# Patient Record
Sex: Female | Born: 2000
Health system: Southern US, Community
[De-identification: ages and names within clinical notes are randomized; demographics above are authoritative.]

## PROBLEM LIST (undated history)

## (undated) DIAGNOSIS — E669 Obesity, unspecified: Secondary | ICD-10-CM

## (undated) DIAGNOSIS — G43909 Migraine, unspecified, not intractable, without status migrainosus: Secondary | ICD-10-CM

## (undated) DIAGNOSIS — J4530 Mild persistent asthma, uncomplicated: Secondary | ICD-10-CM

## (undated) DIAGNOSIS — R109 Unspecified abdominal pain: Secondary | ICD-10-CM

## (undated) DIAGNOSIS — E785 Hyperlipidemia, unspecified: Secondary | ICD-10-CM

## (undated) DIAGNOSIS — F419 Anxiety disorder, unspecified: Secondary | ICD-10-CM

## (undated) DIAGNOSIS — Z635 Disruption of family by separation and divorce: Secondary | ICD-10-CM

## (undated) DIAGNOSIS — F909 Attention-deficit hyperactivity disorder, unspecified type: Secondary | ICD-10-CM

## (undated) DIAGNOSIS — F919 Conduct disorder, unspecified: Secondary | ICD-10-CM

## (undated) DIAGNOSIS — E559 Vitamin D deficiency, unspecified: Secondary | ICD-10-CM

## (undated) DIAGNOSIS — N912 Amenorrhea, unspecified: Secondary | ICD-10-CM

## (undated) DIAGNOSIS — R51 Headache: Secondary | ICD-10-CM

## (undated) DIAGNOSIS — F913 Oppositional defiant disorder: Secondary | ICD-10-CM

## (undated) DIAGNOSIS — F329 Major depressive disorder, single episode, unspecified: Secondary | ICD-10-CM

## (undated) DIAGNOSIS — F4325 Adjustment disorder with mixed disturbance of emotions and conduct: Secondary | ICD-10-CM

## (undated) DIAGNOSIS — J4521 Mild intermittent asthma with (acute) exacerbation: Secondary | ICD-10-CM

## (undated) DIAGNOSIS — F32A Depression, unspecified: Secondary | ICD-10-CM

## (undated) DIAGNOSIS — F912 Conduct disorder, adolescent-onset type: Secondary | ICD-10-CM

## (undated) DIAGNOSIS — K5900 Constipation, unspecified: Secondary | ICD-10-CM

## (undated) DIAGNOSIS — J309 Allergic rhinitis, unspecified: Secondary | ICD-10-CM

## (undated) DIAGNOSIS — L209 Atopic dermatitis, unspecified: Secondary | ICD-10-CM

## (undated) HISTORY — DX: Major depressive disorder, single episode, unspecified: F32.9

## (undated) HISTORY — DX: Depression, unspecified: F32.A

## (undated) HISTORY — DX: Oppositional defiant disorder: F91.3

## (undated) HISTORY — DX: Attention-deficit hyperactivity disorder, unspecified type: F90.9

## (undated) HISTORY — DX: Headache: R51

## (undated) HISTORY — DX: Unspecified abdominal pain: R10.9

---
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
## 2021-01-11 DIAGNOSIS — Z1331 Encounter for screening for depression: Secondary | ICD-10-CM | POA: Diagnosis not present

## 2021-01-11 DIAGNOSIS — L680 Hirsutism: Secondary | ICD-10-CM | POA: Diagnosis not present

## 2021-01-11 DIAGNOSIS — F419 Anxiety disorder, unspecified: Secondary | ICD-10-CM | POA: Diagnosis not present

## 2021-01-11 DIAGNOSIS — L732 Hidradenitis suppurativa: Secondary | ICD-10-CM | POA: Diagnosis not present

## 2021-01-11 DIAGNOSIS — Z1389 Encounter for screening for other disorder: Secondary | ICD-10-CM | POA: Diagnosis not present

## 2021-01-11 DIAGNOSIS — E282 Polycystic ovarian syndrome: Secondary | ICD-10-CM | POA: Diagnosis not present

## 2021-02-08 DIAGNOSIS — E282 Polycystic ovarian syndrome: Secondary | ICD-10-CM | POA: Diagnosis not present

## 2021-02-08 DIAGNOSIS — F419 Anxiety disorder, unspecified: Secondary | ICD-10-CM | POA: Diagnosis not present

## 2021-04-05 DIAGNOSIS — E282 Polycystic ovarian syndrome: Secondary | ICD-10-CM | POA: Diagnosis not present

## 2021-04-05 DIAGNOSIS — F419 Anxiety disorder, unspecified: Secondary | ICD-10-CM | POA: Diagnosis not present

## 2021-04-05 DIAGNOSIS — Z68.41 Body mass index (BMI) pediatric, greater than or equal to 95th percentile for age: Secondary | ICD-10-CM | POA: Diagnosis not present

## 2021-07-06 ENCOUNTER — Encounter: Payer: Self-pay | Admitting: Emergency Medicine

## 2021-07-06 ENCOUNTER — Ambulatory Visit
Admission: EM | Admit: 2021-07-06 | Discharge: 2021-07-06 | Disposition: A | Discharge status: Home / Self Care | Payer: BC Managed Care – PPO | Attending: Emergency Medicine | Admitting: Emergency Medicine

## 2021-07-06 ENCOUNTER — Other Ambulatory Visit: Payer: Self-pay

## 2021-07-06 DIAGNOSIS — Z20822 Contact with and (suspected) exposure to covid-19: Secondary | ICD-10-CM | POA: Diagnosis not present

## 2021-07-06 DIAGNOSIS — J020 Streptococcal pharyngitis: Secondary | ICD-10-CM | POA: Insufficient documentation

## 2021-07-06 DIAGNOSIS — J029 Acute pharyngitis, unspecified: Secondary | ICD-10-CM | POA: Diagnosis not present

## 2021-07-06 LAB — POCT RAPID STREP A (OFFICE): Rapid Strep A Screen: NEGATIVE

## 2021-07-06 MED ORDER — ACETAMINOPHEN 325 MG PO TABS
650.0000 mg | ORAL_TABLET | Freq: Once | ORAL | Status: AC
  Administered 2021-07-06: 650 mg via ORAL

## 2021-07-06 MED ORDER — ONDANSETRON 8 MG PO TBDP: ORAL_TABLET | ORAL | 0 refills | Status: DC

## 2021-07-06 MED ORDER — DEXAMETHASONE SODIUM PHOSPHATE 10 MG/ML IJ SOLN
10.0000 mg | Freq: Once | INTRAMUSCULAR | Status: AC
  Administered 2021-07-06: 10 mg via INTRAMUSCULAR

## 2021-07-06 MED ORDER — IBUPROFEN 600 MG PO TABS: 600.0000 mg | ORAL_TABLET | Freq: Four times a day (QID) | ORAL | 0 refills | Status: DC | PRN

## 2021-07-06 NOTE — ED Triage Notes (Signed)
Body aches, headaches, cough, sore throat and fever started yesterday.  Patient is vomiting too

## 2021-07-06 NOTE — ED Provider Notes (Signed)
HPI  SUBJECTIVE:  Kimberly Munoz is a 20 y.o. female who presents with fevers T-max 100.6, body aches, headaches, nasal congestion, sore throat, postnasal drip, loss of taste, occasional cough, nausea, 5 episodes of emesis starting yesterday.  She states that she is having difficulty tolerating p.o. no rhinorrhea, loss of sense of smell, wheezing, shortness of breath, diarrhea, abdominal pain, rash.  She states that she feels as if her throat is swelling shut.  No muffled voice, drooling, trismus, neck stiffness.  No strep, COVID, flu exposure.  She did not get the COVID or flu vaccines.  No antibiotics in the past month.  No antipyretic in the past 6 hours.  She has tried DayQuil and Chloraseptic throat lozenges.  The throat lozenges help.  Symptoms are worse with swallowing.  She has a past medical history of asthma, PCOS and prediabetes.  LMP: Irregular.  PMD: [facility].  Past Medical History:  Diagnosis Date   Abdominal pain    ADHD (attention deficit hyperactivity disorder)    Adjustment disorder with mixed disturbance of emotions and conduct 03/27/2019   Amenorrhea 03/27/2019   Anxiety disorder 03/27/2019   Asthma    Atopic dermatitis, unspecified 03/27/2019   Conduct disorder, adolescent onset type 03/27/2019   Conduct disorder, unspecified 03/27/2019   Constipation, unspecified 03/27/2019   Depression    Family disruption due to divorce or legal separation 03/27/2019   Head ache 03/27/2019   Headache(784.0)    Hyperlipidemia 03/27/2019   Major depressive disorder 03/27/2019   Migraine    Mild intermittent asthma with acute exacerbation 03/27/2019   Mild persistent asthma 03/27/2019   Obesity, unspecified 03/27/2019   Oppositional defiant disorder    Rhinitis, allergic 03/27/2019   Vitamin D deficiency, unspecified 03/27/2019    History reviewed. No pertinent surgical history.  Family History  Problem Relation Age of Onset   Bipolar disorder Father    Drug abuse  Father    Schizophrenia Father    Migraines Father    Spinal muscular atrophy Father    Diabetes type II Father    Migraines Mother    Hypothyroidism Mother    Migraines Paternal Grandmother    Diabetes type II Paternal Grandmother    Bladder Cancer Paternal Grandmother    Kidney disease Paternal Grandmother    Heart attack Paternal Grandfather    Diabetes type II Paternal Grandfather    Heart failure Paternal Grandfather    Hypertension Paternal Grandfather    Hypertension Maternal Grandfather    GER disease Sister    Anxiety disorder Sister    Depression Sister    GER disease Brother    Ulcers Brother    ADD / ADHD Maternal Aunt    Migraines Maternal Aunt        2 Aunts   Other Maternal Aunt        1 Aunt has Learning Differences   Cholelithiasis Maternal Aunt    Dementia Neg Hx    Celiac disease Neg Hx     Social History   Tobacco Use   Smoking status: Never    Passive exposure: Yes   Smokeless tobacco: Never  Vaping Use   Vaping Use: Some days  Substance Use Topics   Alcohol use: No   Drug use: No     Current Facility-Administered Medications:    dexamethasone (DECADRON) injection 10 mg, 10 mg, Intramuscular, Once, Domenick Gong, MD  Current Outpatient Medications:    ibuprofen (ADVIL) 600 MG tablet, Take 1 tablet (600 mg  total) by mouth every 6 (six) hours as needed., Disp: 30 tablet, Rfl: 0   ondansetron (ZOFRAN-ODT) 8 MG disintegrating tablet, 1/2- 1 tablet q 8 hr prn nausea, vomiting, Disp: 20 tablet, Rfl: 0   cholecalciferol (VITAMIN D) 25 MCG (1000 UNIT) tablet, TAKE 4 TABLETS (4,000 UNITS TOTAL) BY MOUTH DAILY. (Patient not taking: Reported on 07/06/2021), Disp: 100 tablet, Rfl: 11   escitalopram (LEXAPRO) 20 MG tablet, Take 1 tablet (20 mg total) by mouth daily., Disp: 90 tablet, Rfl: 3   fluticasone (FLONASE) 50 MCG/ACT nasal spray, SPRAY 1 SPRAY INTO BOTH NOSTRILS DAILY., Disp: 48 mL, Rfl: 1   ketoconazole (NIZORAL) 2 % shampoo, APPLY 1  APPLICATION TOPICALLY 2 (TWO) TIMES A WEEK., Disp: 120 mL, Rfl: 0   Melatonin 3 MG CAPS, Take 1 capsule (3 mg total) by mouth at bedtime., Disp: 30 capsule, Rfl: 2   metFORMIN (GLUCOPHAGE) 1000 MG tablet, Take 1 tablet (1,000 mg total) by mouth 2 (two) times daily with a meal., Disp: 60 tablet, Rfl: 6  Allergies  Allergen Reactions   Amoxicillin Rash   Zoloft [Sertraline Hcl] Hives     ROS  As noted in HPI.   Physical Exam  BP 133/79 (BP Location: Right Arm) Comment (BP Location): large cuff  Pulse 83   Temp (!) 100.6 F (38.1 C) (Oral)   Resp 20   SpO2 99%   Constitutional: Well developed, well nourished, no acute distress Eyes:  EOMI, conjunctiva normal bilaterally HENT: Normocephalic, atraumatic,mucus membranes moist.  No nasal congestion.  No sinus tenderness.  Tonsils erythematous, swollen with exudates.  Uvula midline. Neck: No cervical lymphadenopathy.  Respiratory: Normal inspiratory effort, lungs clear bilaterally Cardiovascular: Normal rate, regular rhythm, no murmurs GI: nondistended soft, no splenomegaly skin: No rash, skin intact Musculoskeletal: no deformities Neurologic: Alert & oriented x 3, no focal neuro deficits Psychiatric: Speech and behavior appropriate   ED Course   Medications  dexamethasone (DECADRON) injection 10 mg (has no administration in time range)  acetaminophen (TYLENOL) tablet 650 mg (650 mg Oral Given 07/06/21 1933)    Orders Placed This Encounter  Procedures   Culture, group A strep    Standing Status:   Standing    Number of Occurrences:   1   Covid-19, Flu A+B ([facility])    Standing Status:   Standing    Number of Occurrences:   1   POCT rapid strep A    Standing Status:   Standing    Number of Occurrences:   1    Results for orders placed or performed during the hospital encounter of 07/06/21 (from the past 24 hour(s))  POCT rapid strep A     Status: None   Collection Time: 07/06/21  7:39 PM  Result Value Ref Range    Rapid Strep A Screen Negative Negative   No results found.  ED Clinical Impression  1. Exudative pharyngitis   2. Streptococcal sore throat   3. Sore throat   4. Encounter for laboratory testing for COVID-19 virus      ED Assessment/Plan  Patient with an exudative pharyngitis.  Will give 10 mg of dexamethasone for pain and swelling. Centor score 2/4.  Rapid strep negative.  Throat culture sent.  Will call in prescription of antibiotics if it comes back positive.  COVID and flu also sent . will call in Molnupiravir if COVID is positive due to unvaccinated status, BMI above 30, history of asthma, or Tamiflu if flu is positive.  Home with  Zofran, Tylenol/ibuprofen Benadryl/Maalox mixture.  Follow-up with PMD as needed.  ER return precautions given.  COVID, flu pending at the time of initial signing of this note  Discussed labs, MDM, treatment plan, and plan for follow-up with parent and patient. Discussed sn/sx that should prompt return to the ED.They agree with plan.   Meds ordered this encounter  Medications   acetaminophen (TYLENOL) tablet 650 mg   dexamethasone (DECADRON) injection 10 mg   ondansetron (ZOFRAN-ODT) 8 MG disintegrating tablet    Sig: 1/2- 1 tablet q 8 hr prn nausea, vomiting    Dispense:  20 tablet    Refill:  0   ibuprofen (ADVIL) 600 MG tablet    Sig: Take 1 tablet (600 mg total) by mouth every 6 (six) hours as needed.    Dispense:  30 tablet    Refill:  0      *This clinic note was created using Scientist, clinical (histocompatibility and immunogenetics). Therefore, there may be occasional mistakes despite careful proofreading.  ?    Domenick Gong, MD 07/07/21 [phone]

## 2021-07-06 NOTE — Discharge Instructions (Addendum)
your rapid strep was negative today, so we have sent off a throat culture.  We will contact you and call in the appropriate antibiotics if your culture comes back positive for an infection requiring antibiotic treatment.  Give [place] a working phone number.    1 gram of Tylenol and 600 mg ibuprofen together 3-4 times a day as needed for pain.  Make sure you drink plenty of extra fluids.  Some people find salt water gargles and  Traditional Medicinal's "Throat Coat" tea helpful. Take 5 mL of liquid Benadryl and 5 mL of Maalox. Mix it together, and then hold it in your mouth for as long as you can and then swallow. You may do this 4 times a day.  Zofran as needed for nausea vomiting.  Push electrolyte containing fluids such as Pedialyte or Gatorade until your urine is clear.  I will prescribe Tamiflu if your flu is positive or Molnupiravir if your COVID is positive.  We will have that result back in a day or 2.  Go to www.goodrx.com  or www.costplusdrugs.com to look up your medications. This will give you a list of where you can find your prescriptions at the most affordable prices. Or ask the pharmacist what the cash price is, or if they have any other discount programs available to help make your medication more affordable. This can be less expensive than what you would pay with insurance.

## 2021-07-07 LAB — COVID-19, FLU A+B NAA
Influenza A, NAA: NOT DETECTED
Influenza B, NAA: NOT DETECTED
SARS-CoV-2, NAA: NOT DETECTED
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
## 2021-07-10 LAB — CULTURE, GROUP A STREP (THRC)

## 2021-07-14 DIAGNOSIS — E282 Polycystic ovarian syndrome: Secondary | ICD-10-CM | POA: Diagnosis not present

## 2021-07-14 DIAGNOSIS — Z68.41 Body mass index (BMI) pediatric, greater than or equal to 95th percentile for age: Secondary | ICD-10-CM | POA: Diagnosis not present

## 2021-07-14 DIAGNOSIS — F419 Anxiety disorder, unspecified: Secondary | ICD-10-CM | POA: Diagnosis not present

## 2022-02-15 DIAGNOSIS — Z6841 Body Mass Index (BMI) 40.0 and over, adult: Secondary | ICD-10-CM | POA: Insufficient documentation

## 2022-02-15 DIAGNOSIS — J45909 Unspecified asthma, uncomplicated: Secondary | ICD-10-CM | POA: Insufficient documentation

## 2022-02-15 DIAGNOSIS — K3580 Unspecified acute appendicitis: Secondary | ICD-10-CM | POA: Insufficient documentation

## 2022-02-15 DIAGNOSIS — F909 Attention-deficit hyperactivity disorder, unspecified type: Secondary | ICD-10-CM | POA: Insufficient documentation

## 2022-02-15 DIAGNOSIS — F419 Anxiety disorder, unspecified: Secondary | ICD-10-CM | POA: Insufficient documentation

## 2022-02-15 DIAGNOSIS — F32A Depression, unspecified: Secondary | ICD-10-CM | POA: Insufficient documentation

## 2022-02-16 ENCOUNTER — Emergency Department (HOSPITAL_COMMUNITY): Payer: Self-pay

## 2022-02-16 ENCOUNTER — Other Ambulatory Visit: Payer: Self-pay

## 2022-02-16 ENCOUNTER — Emergency Department (HOSPITAL_BASED_OUTPATIENT_CLINIC_OR_DEPARTMENT_OTHER): Payer: Self-pay | Admitting: Certified Registered Nurse Anesthetist

## 2022-02-16 ENCOUNTER — Encounter (HOSPITAL_COMMUNITY): Payer: Self-pay | Admitting: Emergency Medicine

## 2022-02-16 ENCOUNTER — Encounter (HOSPITAL_COMMUNITY)
Admission: EM | Disposition: A | Discharge status: Home / Self Care | Payer: Self-pay | Source: Home / Self Care | Attending: Emergency Medicine

## 2022-02-16 ENCOUNTER — Emergency Department (HOSPITAL_COMMUNITY): Payer: Self-pay | Admitting: Certified Registered Nurse Anesthetist

## 2022-02-16 ENCOUNTER — Ambulatory Visit (HOSPITAL_COMMUNITY)
Admission: EM | Admit: 2022-02-16 | Discharge: 2022-02-16 | Disposition: A | Discharge status: Home / Self Care | Payer: Self-pay | Attending: General Surgery | Admitting: General Surgery

## 2022-02-16 DIAGNOSIS — K3580 Unspecified acute appendicitis: Secondary | ICD-10-CM

## 2022-02-16 DIAGNOSIS — J45909 Unspecified asthma, uncomplicated: Secondary | ICD-10-CM

## 2022-02-16 DIAGNOSIS — Z68.41 Body mass index (BMI) pediatric, greater than or equal to 95th percentile for age: Secondary | ICD-10-CM

## 2022-02-16 DIAGNOSIS — K353 Acute appendicitis with localized peritonitis, without perforation or gangrene: Secondary | ICD-10-CM | POA: Insufficient documentation

## 2022-02-16 HISTORY — PX: LAPAROSCOPIC APPENDECTOMY: SHX408

## 2022-02-16 LAB — URINALYSIS, ROUTINE W REFLEX MICROSCOPIC
Bilirubin Urine: NEGATIVE
Glucose, UA: NEGATIVE mg/dL
Hgb urine dipstick: NEGATIVE
Ketones, ur: NEGATIVE mg/dL
Leukocytes,Ua: NEGATIVE
Nitrite: NEGATIVE
Protein, ur: NEGATIVE mg/dL
Specific Gravity, Urine: 1.02 (ref 1.005–1.030)
pH: 6.5 (ref 5.0–8.0)

## 2022-02-16 LAB — CBC
HCT: 41.2 % (ref 36.0–46.0)
Hemoglobin: 13.1 g/dL (ref 12.0–15.0)
MCH: 27.2 pg (ref 26.0–34.0)
MCHC: 31.8 g/dL (ref 30.0–36.0)
MCV: 85.7 fL (ref 80.0–100.0)
Platelets: 337 10*3/uL (ref 150–400)
RBC: 4.81 MIL/uL (ref 3.87–5.11)
RDW: 13.3 % (ref 11.5–15.5)
WBC: 7.6 10*3/uL (ref 4.0–10.5)
nRBC: 0 % (ref 0.0–0.2)

## 2022-02-16 LAB — COMPREHENSIVE METABOLIC PANEL
ALT: 27 U/L (ref 0–44)
AST: 17 U/L (ref 15–41)
Albumin: 4 g/dL (ref 3.5–5.0)
Alkaline Phosphatase: 53 U/L (ref 38–126)
Anion gap: 5 (ref 5–15)
BUN: 9 mg/dL (ref 6–20)
CO2: 27 mmol/L (ref 22–32)
Calcium: 9 mg/dL (ref 8.9–10.3)
Chloride: 106 mmol/L (ref 98–111)
Creatinine, Ser: 0.62 mg/dL (ref 0.44–1.00)
GFR, Estimated: >60 mL/min (ref >60)
Glucose, Bld: 140 mg/dL — ABNORMAL HIGH (ref 70–99)
Potassium: 3.7 mmol/L (ref 3.5–5.1)
Sodium: 138 mmol/L (ref 135–145)
Total Bilirubin: 0.2 mg/dL — ABNORMAL LOW (ref 0.3–1.2)
Total Protein: 7.5 g/dL (ref 6.5–8.1)

## 2022-02-16 LAB — LIPASE, BLOOD: Lipase: 30 U/L (ref 11–51)

## 2022-02-16 LAB — PREGNANCY, URINE: Preg Test, Ur: NEGATIVE

## 2022-02-16 SURGERY — APPENDECTOMY, LAPAROSCOPIC
Anesthesia: General | Site: Abdomen

## 2022-02-16 MED ORDER — LACTATED RINGERS IV SOLN: INTRAVENOUS | Status: DC | PRN

## 2022-02-16 MED ORDER — MEPERIDINE HCL 50 MG/ML IJ SOLN: 6.2500 mg | INTRAMUSCULAR | Status: DC | PRN

## 2022-02-16 MED ORDER — SUCCINYLCHOLINE CHLORIDE 200 MG/10ML IV SOSY
PREFILLED_SYRINGE | INTRAVENOUS | Status: AC
  Filled 2022-02-16: qty 10

## 2022-02-16 MED ORDER — HYDROCODONE-ACETAMINOPHEN 5-325 MG PO TABS: 1.0000 | ORAL_TABLET | ORAL | 0 refills | Status: AC | PRN

## 2022-02-16 MED ORDER — SODIUM CHLORIDE FLUSH 0.9 % IV SOLN
INTRAVENOUS | Status: AC
  Filled 2022-02-16: qty 10

## 2022-02-16 MED ORDER — BUPIVACAINE LIPOSOME 1.3 % IJ SUSP
INTRAMUSCULAR | Status: AC
  Filled 2022-02-16: qty 20

## 2022-02-16 MED ORDER — MIDAZOLAM HCL 2 MG/2ML IJ SOLN
INTRAMUSCULAR | Status: AC
  Filled 2022-02-16: qty 2

## 2022-02-16 MED ORDER — ROCURONIUM BROMIDE 100 MG/10ML IV SOLN
INTRAVENOUS | Status: DC | PRN
  Administered 2022-02-16: 50 mg via INTRAVENOUS

## 2022-02-16 MED ORDER — LACTATED RINGERS IV SOLN: INTRAVENOUS | Status: DC

## 2022-02-16 MED ORDER — CHLORHEXIDINE GLUCONATE CLOTH 2 % EX PADS: 6.0000 | MEDICATED_PAD | Freq: Once | CUTANEOUS | Status: DC

## 2022-02-16 MED ORDER — KETAMINE HCL 50 MG/5ML IJ SOSY
PREFILLED_SYRINGE | INTRAMUSCULAR | Status: AC
  Filled 2022-02-16: qty 5

## 2022-02-16 MED ORDER — DEXAMETHASONE SODIUM PHOSPHATE 10 MG/ML IJ SOLN
INTRAMUSCULAR | Status: DC | PRN
  Administered 2022-02-16: 10 mg via INTRAVENOUS

## 2022-02-16 MED ORDER — KETAMINE HCL 10 MG/ML IJ SOLN
INTRAMUSCULAR | Status: DC | PRN
  Administered 2022-02-16: 10 mg via INTRAVENOUS

## 2022-02-16 MED ORDER — PROPOFOL 10 MG/ML IV BOLUS
INTRAVENOUS | Status: AC
  Filled 2022-02-16: qty 20

## 2022-02-16 MED ORDER — IOHEXOL 350 MG/ML SOLN
100.0000 mL | Freq: Once | INTRAVENOUS | Status: AC | PRN
  Administered 2022-02-16: 100 mL via INTRAVENOUS

## 2022-02-16 MED ORDER — FENTANYL CITRATE (PF) 100 MCG/2ML IJ SOLN
INTRAMUSCULAR | Status: DC | PRN
  Administered 2022-02-16 (×3): 50 ug via INTRAVENOUS
  Administered 2022-02-16: 100 ug via INTRAVENOUS

## 2022-02-16 MED ORDER — ONDANSETRON HCL 4 MG/2ML IJ SOLN
INTRAMUSCULAR | Status: DC | PRN
  Administered 2022-02-16: 8 mg via INTRAVENOUS

## 2022-02-16 MED ORDER — ROCURONIUM BROMIDE 10 MG/ML (PF) SYRINGE
PREFILLED_SYRINGE | INTRAVENOUS | Status: AC
  Filled 2022-02-16: qty 10

## 2022-02-16 MED ORDER — KETOROLAC TROMETHAMINE 15 MG/ML IJ SOLN
INTRAMUSCULAR | Status: DC | PRN
  Administered 2022-02-16: 30 mg via INTRAVENOUS

## 2022-02-16 MED ORDER — FENTANYL CITRATE (PF) 250 MCG/5ML IJ SOLN
INTRAMUSCULAR | Status: AC
  Filled 2022-02-16: qty 5

## 2022-02-16 MED ORDER — SUGAMMADEX SODIUM 500 MG/5ML IV SOLN
INTRAVENOUS | Status: AC
  Filled 2022-02-16: qty 5

## 2022-02-16 MED ORDER — DEXAMETHASONE SODIUM PHOSPHATE 10 MG/ML IJ SOLN
INTRAMUSCULAR | Status: AC
  Filled 2022-02-16: qty 1

## 2022-02-16 MED ORDER — HYDROMORPHONE HCL 1 MG/ML IJ SOLN
0.2500 mg | INTRAMUSCULAR | Status: DC | PRN
  Administered 2022-02-16: 0.5 mg via INTRAVENOUS
  Filled 2022-02-16: qty 0.5

## 2022-02-16 MED ORDER — BUPIVACAINE LIPOSOME 1.3 % IJ SUSP
INTRAMUSCULAR | Status: DC | PRN
  Administered 2022-02-16: 20 mL

## 2022-02-16 MED ORDER — MIDAZOLAM HCL 5 MG/5ML IJ SOLN
INTRAMUSCULAR | Status: DC | PRN
  Administered 2022-02-16: 2 mg via INTRAVENOUS

## 2022-02-16 MED ORDER — ONDANSETRON HCL 4 MG/2ML IJ SOLN: 4.0000 mg | Freq: Once | INTRAMUSCULAR | Status: DC | PRN

## 2022-02-16 MED ORDER — KETOROLAC TROMETHAMINE 30 MG/ML IJ SOLN
INTRAMUSCULAR | Status: AC
  Filled 2022-02-16: qty 1

## 2022-02-16 MED ORDER — ORAL CARE MOUTH RINSE: 15.0000 mL | Freq: Once | OROMUCOSAL | Status: DC

## 2022-02-16 MED ORDER — METRONIDAZOLE 500 MG/100ML IV SOLN
500.0000 mg | Freq: Two times a day (BID) | INTRAVENOUS | Status: DC
  Administered 2022-02-16: 500 mg via INTRAVENOUS
  Filled 2022-02-16: qty 100

## 2022-02-16 MED ORDER — SODIUM CHLORIDE 0.9 % IV SOLN
1.0000 g | Freq: Once | INTRAVENOUS | Status: AC
  Administered 2022-02-16: 1 g via INTRAVENOUS
  Filled 2022-02-16: qty 10

## 2022-02-16 MED ORDER — ONDANSETRON HCL 4 MG/2ML IJ SOLN
INTRAMUSCULAR | Status: AC
  Filled 2022-02-16: qty 4

## 2022-02-16 MED ORDER — CHLORHEXIDINE GLUCONATE 0.12 % MT SOLN: 15.0000 mL | Freq: Once | OROMUCOSAL | Status: DC

## 2022-02-16 MED ORDER — LIDOCAINE 2% (20 MG/ML) 5 ML SYRINGE
INTRAMUSCULAR | Status: DC | PRN
  Administered 2022-02-16: 100 mg via INTRAVENOUS

## 2022-02-16 MED ORDER — PROPOFOL 10 MG/ML IV BOLUS
INTRAVENOUS | Status: DC | PRN
  Administered 2022-02-16: 50 mg via INTRAVENOUS

## 2022-02-16 MED ORDER — LIDOCAINE HCL (PF) 2 % IJ SOLN
INTRAMUSCULAR | Status: AC
  Filled 2022-02-16: qty 5

## 2022-02-16 MED ORDER — SODIUM CHLORIDE 0.9 % IR SOLN
Status: DC | PRN
  Administered 2022-02-16: 1000 mL

## 2022-02-16 SURGICAL SUPPLY — 48 items
ADH SKN CLS APL DERMABOND .7 (GAUZE/BANDAGES/DRESSINGS) ×1
APL PRP STRL LF DISP 70% ISPRP (MISCELLANEOUS) ×1
CHLORAPREP W/TINT 26 (MISCELLANEOUS) ×3 IMPLANT
CLOTH BEACON ORANGE TIMEOUT ST (SAFETY) ×3 IMPLANT
COVER LIGHT HANDLE STERIS (MISCELLANEOUS) ×6 IMPLANT
CUTTER FLEX LINEAR 45M (STAPLE) ×3 IMPLANT
DERMABOND ADVANCED (GAUZE/BANDAGES/DRESSINGS) ×1
DERMABOND ADVANCED .7 DNX12 (GAUZE/BANDAGES/DRESSINGS) ×2 IMPLANT
ELECT REM PT RETURN 9FT ADLT (ELECTROSURGICAL) ×2
ELECTRODE REM PT RTRN 9FT ADLT (ELECTROSURGICAL) ×2 IMPLANT
GLOVE BIO SURGEON STRL SZ 6.5 (GLOVE) ×1 IMPLANT
GLOVE BIOGEL PI IND STRL 6.5 (GLOVE) IMPLANT
GLOVE BIOGEL PI IND STRL 7.0 (GLOVE) ×4 IMPLANT
GLOVE BIOGEL PI INDICATOR 6.5 (GLOVE) ×1
GLOVE BIOGEL PI INDICATOR 7.0 (GLOVE) ×2
GLOVE SS BIOGEL STRL SZ 6.5 (GLOVE) IMPLANT
GLOVE SUPERSENSE BIOGEL SZ 6.5 (GLOVE) ×1
GLOVE SURG SS PI 7.5 STRL IVOR (GLOVE) ×3 IMPLANT
GOWN STRL REUS W/TWL LRG LVL3 (GOWN DISPOSABLE) ×7 IMPLANT
INST SET LAPROSCOPIC AP (KITS) ×3 IMPLANT
KIT TURNOVER KIT A (KITS) ×3 IMPLANT
MANIFOLD NEPTUNE II (INSTRUMENTS) ×3 IMPLANT
NDL HYPO 21X1.5 SAFETY (NEEDLE) ×2 IMPLANT
NDL INSUFFLATION 14GA 120MM (NEEDLE) ×2 IMPLANT
NEEDLE HYPO 21X1.5 SAFETY (NEEDLE) ×2 IMPLANT
NEEDLE INSUFFLATION 14GA 120MM (NEEDLE) ×2 IMPLANT
NS IRRIG 1000ML POUR BTL (IV SOLUTION) ×3 IMPLANT
PACK LAP CHOLE LZT030E (CUSTOM PROCEDURE TRAY) ×3 IMPLANT
PAD ARMBOARD 7.5X6 YLW CONV (MISCELLANEOUS) ×3 IMPLANT
PENCIL HANDSWITCHING (ELECTRODE) ×1 IMPLANT
RELOAD 45 VASCULAR/THIN (ENDOMECHANICALS) ×2 IMPLANT
RELOAD STAPLE 45 2.5 WHT GRN (ENDOMECHANICALS) IMPLANT
SET BASIN LINEN APH (SET/KITS/TRAYS/PACK) ×3 IMPLANT
SET TUBE IRRIG SUCTION NO TIP (IRRIGATION / IRRIGATOR) ×3 IMPLANT
SET TUBE SMOKE EVAC HIGH FLOW (TUBING) ×3 IMPLANT
SHEARS HARMONIC ACE PLUS 36CM (ENDOMECHANICALS) ×3 IMPLANT
SUT MNCRL AB 4-0 PS2 18 (SUTURE) ×6 IMPLANT
SUT VICRYL 0 UR6 27IN ABS (SUTURE) ×3 IMPLANT
SYR 20ML LL LF (SYRINGE) ×5 IMPLANT
SYS BAG RETRIEVAL 10MM (BASKET) ×2
SYSTEM BAG RETRIEVAL 10MM (BASKET) ×2 IMPLANT
TRAY FOLEY W/BAG SLVR 16FR (SET/KITS/TRAYS/PACK) ×2
TRAY FOLEY W/BAG SLVR 16FR ST (SET/KITS/TRAYS/PACK) ×2 IMPLANT
TROCAR Z-THAD FIOS HNDL 12X100 (TROCAR) ×3 IMPLANT
TROCAR Z-THRD FIOS HNDL 11X100 (TROCAR) ×3 IMPLANT
TROCAR Z-THREAD FIOS 5X100MM (TROCAR) ×3 IMPLANT
TUBE CONNECTING 12X1/4 (SUCTIONS) ×3 IMPLANT
WARMER LAPAROSCOPE (MISCELLANEOUS) ×3 IMPLANT

## 2022-02-16 NOTE — H&P (View-Only) (Signed)
Reason for Consult: Acute appendicitis Referring Physician: [name] is an 21 y.o. female.  HPI: Patient is a 21 year old white female who presented emergency room with a 3-day history of worsening nausea, vomiting, and intermittent right lower quadrant abdominal pain.  She denies any fever or chills.  A CT scan of the abdomen pelvis revealed early acute appendicitis.  She has had multiple GI issues in the past.  She currently states her pain has resolved.  She does have a decreased appetite.  She denies any diarrhea.  Past Medical History:  Diagnosis Date   Abdominal pain    ADHD (attention deficit hyperactivity disorder)    Adjustment disorder with mixed disturbance of emotions and conduct 03/27/2019   Amenorrhea 03/27/2019   Anxiety disorder 03/27/2019   Asthma    Atopic dermatitis, unspecified 03/27/2019   Conduct disorder, adolescent onset type 03/27/2019   Conduct disorder, unspecified 03/27/2019   Constipation, unspecified 03/27/2019   Depression    Family disruption due to divorce or legal separation 03/27/2019   Head ache 03/27/2019   Headache(784.0)    Hyperlipidemia 03/27/2019   Major depressive disorder 03/27/2019   Migraine    Mild intermittent asthma with acute exacerbation 03/27/2019   Mild persistent asthma 03/27/2019   Obesity, unspecified 03/27/2019   Oppositional defiant disorder    Rhinitis, allergic 03/27/2019   Vitamin D deficiency, unspecified 03/27/2019    History reviewed. No pertinent surgical history.  Family History  Problem Relation Age of Onset   Bipolar disorder Father    Drug abuse Father    Schizophrenia Father    Migraines Father    Spinal muscular atrophy Father    Diabetes type II Father    Migraines Mother    Hypothyroidism Mother    Migraines Paternal Grandmother    Diabetes type II Paternal Grandmother    Bladder Cancer Paternal Grandmother    Kidney disease Paternal Grandmother    Heart attack Paternal  Grandfather    Diabetes type II Paternal Grandfather    Heart failure Paternal Grandfather    Hypertension Paternal Grandfather    Hypertension Maternal Grandfather    GER disease Sister    Anxiety disorder Sister    Depression Sister    GER disease Brother    Ulcers Brother    ADD / ADHD Maternal Aunt    Migraines Maternal Aunt        2 Aunts   Other Maternal Aunt        1 Aunt has Learning Differences   Cholelithiasis Maternal Aunt    Dementia Neg Hx    Celiac disease Neg Hx     Social History:  reports that she has never smoked. She has been exposed to tobacco smoke. She has never used smokeless tobacco. She reports that she does not drink alcohol and does not use drugs.  Allergies:  Allergies  Allergen Reactions   Amoxicillin Rash   Zoloft [Sertraline Hcl] Hives    Medications: I have reviewed the patient's current medications.  Results for orders placed or performed during the hospital encounter of 02/16/22 (from the past 48 hour(s))  CBC     Status: None   Collection Time: 02/16/22  3:12 AM  Result Value Ref Range   WBC 7.6 4.0 - 10.5 K/uL   RBC 4.81 3.87 - 5.11 MIL/uL   Hemoglobin 13.1 12.0 - 15.0 g/dL   HCT 25.0 53.9 - 76.7 %   MCV 85.7 80.0 - 100.0 fL   MCH  27.2 26.0 - 34.0 pg   MCHC 31.8 30.0 - 36.0 g/dL   RDW 58.5 27.7 - 82.4 %   Platelets 337 150 - 400 K/uL   nRBC 0.0 0.0 - 0.2 %    Comment: Performed at [facility], [address], [place], [place] [address]  Comprehensive metabolic panel     Status: Abnormal   Collection Time: 02/16/22  3:12 AM  Result Value Ref Range   Sodium 138 135 - 145 mmol/L   Potassium 3.7 3.5 - 5.1 mmol/L   Chloride 106 98 - 111 mmol/L   CO2 27 22 - 32 mmol/L   Glucose, Bld 140 (H) 70 - 99 mg/dL    Comment: Glucose reference range applies only to samples taken after fasting for at least 8 hours.   BUN 9 6 - 20 mg/dL   Creatinine, Ser 1.44 0.44 - 1.00 mg/dL   Calcium 9.0 8.9 - 31.5 mg/dL   Total Protein 7.5 6.5 - 8.1 g/dL    Albumin 4.0 3.5 - 5.0 g/dL   AST 17 15 - 41 U/L   ALT 27 0 - 44 U/L   Alkaline Phosphatase 53 38 - 126 U/L   Total Bilirubin 0.2 (L) 0.3 - 1.2 mg/dL   GFR, Estimated >40 >08 mL/min    Comment: (NOTE) Calculated using the CKD-EPI Creatinine Equation (2021)    Anion gap 5 5 - 15    Comment: Performed at [facility], [address] [place], [place] [address]  Lipase, blood     Status: None   Collection Time: 02/16/22  3:12 AM  Result Value Ref Range   Lipase 30 11 - 51 U/L    Comment: Performed at [facility], [address], [place], [place] [address]  Urinalysis, Routine w reflex microscopic Urine, Clean Catch     Status: None   Collection Time: 02/16/22  3:58 AM  Result Value Ref Range   Color, Urine YELLOW YELLOW   APPearance CLEAR CLEAR   Specific Gravity, Urine 1.020 1.005 - 1.030   pH 6.5 5.0 - 8.0   Glucose, UA NEGATIVE NEGATIVE mg/dL   Hgb urine dipstick NEGATIVE NEGATIVE   Bilirubin Urine NEGATIVE NEGATIVE   Ketones, ur NEGATIVE NEGATIVE mg/dL   Protein, ur NEGATIVE NEGATIVE mg/dL   Nitrite NEGATIVE NEGATIVE   Leukocytes,Ua NEGATIVE NEGATIVE    Comment: Microscopic not done on urines with negative protein, blood, leukocytes, nitrite, or glucose < 500 mg/dL. Performed at [facility], [address], [place], [place] [address]   Pregnancy, urine     Status: None   Collection Time: 02/16/22  3:58 AM  Result Value Ref Range   Preg Test, Ur NEGATIVE NEGATIVE    Comment:        THE SENSITIVITY OF THIS METHODOLOGY IS >20 mIU/mL. Performed at [facility], [address] [place], [place] [address]     CT ABDOMEN PELVIS W CONTRAST  Result Date: 02/16/2022 CLINICAL DATA:  Right lower quadrant abdominal pain.  Vomiting. EXAM: CT ABDOMEN AND PELVIS WITH CONTRAST TECHNIQUE: Multidetector CT imaging of the abdomen and pelvis was performed using the standard protocol following bolus administration of intravenous contrast. RADIATION DOSE REDUCTION: This exam was performed  according to the departmental dose-optimization program which includes automated exposure control, adjustment of the mA and/or kV according to patient size and/or use of iterative reconstruction technique. CONTRAST:  OMNIPAQUE IOHEXOL 350 MG/ML SOLN COMPARISON:  10/08/2019 FINDINGS: Lower chest: Unremarkable. Hepatobiliary: No suspicious focal abnormality within the  liver parenchyma. There is no evidence for gallstones, gallbladder wall thickening, or pericholecystic fluid. No intrahepatic or extrahepatic biliary dilation. Pancreas: No focal mass lesion. No dilatation of the main duct. No intraparenchymal cyst. No peripancreatic edema. Spleen: No splenomegaly. No focal mass lesion. Adrenals/Urinary Tract: No adrenal nodule or mass. Kidneys unremarkable. No evidence for hydroureter. The urinary bladder appears normal for the degree of distention. Stomach/Bowel: Stomach is unremarkable. No gastric wall thickening. No evidence of outlet obstruction. Duodenum is normally positioned as is the ligament of Treitz. No small bowel wall thickening. No small bowel dilatation. The terminal ileum is normal. Appendix is elongated and dilated up to 7-8 mm (increased from 5 mm previously) with apparent transmural hyperenhancement and ill-defined mild wall thickening. Subtle periappendiceal edema is suspected on coronal images 53-57 of series 5 and sagittal image 45 of series 6. No evidence for extraluminal gas or right lower quadrant abscess. No gross colonic mass. No colonic wall thickening. Vascular/Lymphatic: No abdominal aortic aneurysm. No abdominal aortic atherosclerotic calcification. There is no gastrohepatic or hepatoduodenal ligament lymphadenopathy. No retroperitoneal or mesenteric lymphadenopathy. Reproductive: The uterus is unremarkable.  There is no adnexal mass. Other: No intraperitoneal free fluid. Musculoskeletal: No worrisome lytic or sclerotic osseous abnormality. IMPRESSION: 1. Subtle changes in and around  the appendix suspicious for early acute appendicitis. No findings to suggest perforation or abscess. 2. Otherwise unremarkable exam. Electronically Signed   By: Eric  Mansell M.D.   On: 02/16/2022 05:45    ROS:  Pertinent items are noted in HPI.  Blood pressure 114/67, pulse 71, temperature 97.8 F (36.6 C), temperature source Oral, resp. rate 18, height 5' 10" (1.778 m), weight (!) 142.9 kg, SpO2 100 %. Physical Exam: Pleasant morbidly obese white female no acute distress Head is normocephalic, atraumatic Lungs clear to auscultation with equal breath sounds bilaterally Heart examination reveals regular rate and rhythm without S3, S4, murmurs Abdomen is soft with minimal tenderness along the right flank.  No McBurney's point tenderness is noted.  No rigidity is noted.  CT scan images personally reviewed  Assessment/Plan: Impression: Early acute appendicitis Plan: I did discuss options including appendectomy versus antibiotic outpatient therapy.  She would like to proceed with a laparoscopic appendectomy.  The risks and benefits of the procedure including bleeding, infection, and the possibility of an open procedure were fully explained to the patient, who gave informed consent.  Ledonna Dormer 02/16/2022, 7:51 AM      

## 2022-02-16 NOTE — Anesthesia Procedure Notes (Signed)
Procedure Name: Intubation Date/Time: 02/16/2022 12:30 PM  Performed by: Marny Lowenstein, CRNAPre-anesthesia Checklist: Patient identified, Emergency Drugs available, Suction available and Patient being monitored Patient Re-evaluated:Patient Re-evaluated prior to induction Oxygen Delivery Method: Circle system utilized Preoxygenation: Pre-oxygenation with 100% oxygen Induction Type: IV induction Ventilation: Mask ventilation without difficulty Laryngoscope Size: Miller and 2 Grade View: Grade I Tube type: Oral Tube size: 7.0 mm Number of attempts: 1 Airway Equipment and Method: Stylet and Oral airway Placement Confirmation: ETT inserted through vocal cords under direct vision, positive ETCO2 and breath sounds checked- equal and bilateral Tube secured with: Tape Dental Injury: Teeth and Oropharynx as per pre-operative assessment

## 2022-02-16 NOTE — ED Provider Notes (Signed)
[facility]-EMERGENCY DEPT [facility] Emergency Department Provider Note MRN:  [id number]  Arrival date & time: 02/16/22     Chief Complaint   Abdominal Pain   History of Present Illness   Kimberly Munoz is a 21 y.o. year-old female with a history of asthma presenting to the ED with chief complaint of abdominal pain.  RLQ abdominal pain for the past few days.  Worse with evening with nausea and vomiting.  No fever, no dysuria or hematuria, no vaginal bleeding or discharge.  No diarrhea or constipation.  Review of Systems  A thorough review of systems was obtained and all systems are negative except as noted in the HPI and PMH.   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   Amenorrhea 03/27/2019   Anxiety disorder 03/27/2019   Asthma    Atopic dermatitis, unspecified 03/27/2019   Conduct disorder, adolescent onset type 03/27/2019   Conduct disorder, unspecified 03/27/2019   Constipation, unspecified 03/27/2019   Depression    Family disruption due to divorce or legal separation 03/27/2019   Head ache 03/27/2019   Headache(784.0)    Hyperlipidemia 03/27/2019   Major depressive disorder 03/27/2019   Migraine    Mild intermittent asthma with acute exacerbation 03/27/2019   Mild persistent asthma 03/27/2019   Obesity, unspecified 03/27/2019   Oppositional defiant disorder    Rhinitis, allergic 03/27/2019   Vitamin D deficiency, unspecified 03/27/2019    History reviewed. No pertinent surgical history.  Family History  Problem Relation Age of Onset   Bipolar disorder Father    Drug abuse Father    Schizophrenia Father    Migraines Father    Spinal muscular atrophy Father    Diabetes type II Father    Migraines Mother    Hypothyroidism Mother    Migraines Paternal Grandmother    Diabetes type II  Paternal Grandmother    Bladder Cancer Paternal Grandmother    Kidney disease Paternal Grandmother    Heart attack Paternal Grandfather    Diabetes type II Paternal Grandfather    Heart failure Paternal Grandfather    Hypertension Paternal Grandfather    Hypertension Maternal Grandfather    GER disease Sister    Anxiety disorder Sister    Depression Sister    GER disease Brother    Ulcers Brother    ADD / ADHD Maternal Aunt    Migraines Maternal Aunt        2 Aunts   Other Maternal Aunt        1 Aunt has Learning Differences   Cholelithiasis Maternal Aunt    Dementia Neg Hx    Celiac disease Neg Hx     Social History   Socioeconomic History   Marital status: Single    Spouse name: Not on file   Number of children: 0   Years of education: Not on file   Highest education level: Not on file  Occupational History   Not on file  Tobacco Use   Smoking status: Never    Passive exposure: Yes   Smokeless tobacco: Never  Vaping Use   Vaping Use: Every day  Substance and Sexual Activity   Alcohol use: No   Drug use: No   Sexual activity: Never    Comment: Heterosexual  Other Topics Concern   Not on file  Social History  Narrative   Lives with mom and step dad.    She is in 12th grade at [employer].    Social Determinants of Health   Financial Resource Strain: Not on file  Food Insecurity: Not on file  Transportation Needs: Not on file  Physical Activity: Not on file  Stress: Not on file  Social Connections: Not on file  Intimate Partner Violence: Not on file     Physical Exam   Vitals:   02/16/22 0345 02/16/22 0435  BP: 128/79 132/87  Pulse: 76 77  Resp: 18 18  Temp:    SpO2: 100% 99%    CONSTITUTIONAL:  well-appearing, NAD NEURO/PSYCH:  Alert and oriented x 3, no focal deficits EYES:  eyes equal and reactive ENT/NECK:  no LAD, no JVD CARDIO:  regular rate, well-perfused, normal S1 and S2 PULM:  CTAB no wheezing or rhonchi GI/GU:   non-distended, non-tender MSK/SPINE:  No gross deformities, no edema SKIN:  no rash, atraumatic   *Additional and/or pertinent findings included in MDM below  Diagnostic and Interventional Summary    EKG Interpretation  Date/Time:    Ventricular Rate:    PR Interval:    QRS Duration:   QT Interval:    QTC Calculation:   R Axis:     Text Interpretation:         Labs Reviewed  COMPREHENSIVE METABOLIC PANEL - Abnormal; Notable for the following components:      Result Value   Glucose, Bld 140 (*)    Total Bilirubin 0.2 (*)    All other components within normal limits  CBC  LIPASE, BLOOD  URINALYSIS, ROUTINE W REFLEX MICROSCOPIC  PREGNANCY, URINE    CT ABDOMEN PELVIS W CONTRAST  Final Result      Medications  iohexol (OMNIPAQUE) 350 MG/ML injection 100 mL (100 mLs Intravenous Contrast Given 02/16/22 0506)     Procedures  /  Critical Care .Critical Care  Performed by: Sabas Sous, MD Authorized by: Sabas Sous, MD   Critical care provider statement:    Critical care time (minutes):  35   Critical care was necessary to treat or prevent imminent or life-threatening deterioration of the following conditions: Concern for acute appendicitis.   Critical care was time spent personally by me on the following activities:  Development of treatment plan with patient or surrogate, discussions with consultants, evaluation of patient's response to treatment, examination of patient, ordering and review of laboratory studies, ordering and review of radiographic studies, ordering and performing treatments and interventions, pulse oximetry, re-evaluation of patient's condition and review of old charts   ED Course and Medical Decision Making  Initial Impression and Ddx Rlq abdominal pain considering appendicitis, diverticulitis, kidney stone, ovarian cyst.  CT pending  Past medical/surgical history that increases complexity of ED encounter: None  Interpretation of  Diagnostics I personally reviewed the laboratory assessment and my interpretation is as follows: No significant blood count or electrolyte disturbance  CT with suspicion for early appendicitis  Patient Reassessment and Ultimate Disposition/Management     Patient to be evaluated in the emergency department by Dr. Henreitta Leber or Dr. Lovell Sheehan to determine management.  Signed out to oncoming provider at shift change.  Patient management required discussion with the following services or consulting groups:  General/Trauma Surgery  Complexity of Problems Addressed Acute illness or injury that poses threat of life of bodily function  Additional Data Reviewed and Analyzed Further history obtained from: Further history from spouse/family member  Additional  Factors Impacting ED Encounter Risk Consideration of hospitalization  Barth Kirks. Sedonia Small, [place] [email or web address]  Final Clinical Impressions(s) / ED Diagnoses     ICD-10-CM   1. Acute appendicitis, unspecified acute appendicitis type  K35.80       ED Discharge Orders     None        Discharge Instructions Discussed with and Provided to Patient:   Discharge Instructions   None      Maudie Flakes, MD 02/16/22 [phone]

## 2022-02-16 NOTE — Op Note (Signed)
Patient:  Kimberly Munoz  DOB:  15-Mar-2001  MRN:  [id number]   Preop Diagnosis: Acute appendicitis  Postop Diagnosis: Same  Procedure: Laparoscopic appendectomy  Surgeon: Franky Macho, MD  Anes: General endotracheal  Indications: Patient is a 21 year old white female who presents to the emergency room with worsening right lower quadrant abdominal pain.  CT scan of the abdomen reveals acute appendicitis.  The risks and benefits of the procedure including bleeding, infection, and the possibility of an open procedure were fully explained to the patient, who gave informed consent.  Procedure note: The patient was placed in the supine position.  After induction of general endotracheal anesthesia, the abdomen was prepped and draped using the usual sterile technique with ChloraPrep.  Surgical site confirmation was performed.  A supraumbilical incision was made down to the fascia.  A Veress needle was introduced into the abdominal cavity and confirmation of placement was done using the saline drop test.  The abdomen was then insufflated to 15 mmHg pressure.  An 11 mm trocar was introduced into the abdominal cavity under direct visualization without difficulty.  The patient was placed in deeper Trendelenburg position and an additional 12 mm trocar was placed in the suprapubic region and a 5 mm trocar was placed in the left lower quadrant region.  The appendix was visualized and noted to be mildly injected and inflamed.  There was no evidence of perforation.  The mesoappendix was divided using the harmonic scalpel.  The juncture of the appendix and cecum was fully visualized.  A vascular Endo GIA was placed across the base of the appendix and fired.  The appendix was then removed using an Endo Catch bag without difficulty.  The staple line was inspected and noted to be within normal limits.  All fluid and air were then evacuated from the abdominal cavity prior to the removal of the trocars.  All wounds  were irrigated with normal saline.  All wounds were injected with Exparel.  All incisions were closed using a 4-0 Monocryl subcuticular suture.  Dermabond was applied.  All tape and needle counts were correct at the end of the procedure.  The patient was extubated in the operating room and transferred to PACU in stable condition.  Complications: None  EBL: Minimal  Specimen: Appendix

## 2022-02-16 NOTE — Consult Note (Signed)
Reason for Consult: Acute appendicitis Referring Physician: [name] is an 21 y.o. female.  HPI: Patient is a 21 year old white female who presented emergency room with a 3-day history of worsening nausea, vomiting, and intermittent right lower quadrant abdominal pain.  She denies any fever or chills.  A CT scan of the abdomen pelvis revealed early acute appendicitis.  She has had multiple GI issues in the past.  She currently states her pain has resolved.  She does have a decreased appetite.  She denies any diarrhea.  Past Medical History:  Diagnosis Date   Abdominal pain    ADHD (attention deficit hyperactivity disorder)    Adjustment disorder with mixed disturbance of emotions and conduct 03/27/2019   Amenorrhea 03/27/2019   Anxiety disorder 03/27/2019   Asthma    Atopic dermatitis, unspecified 03/27/2019   Conduct disorder, adolescent onset type 03/27/2019   Conduct disorder, unspecified 03/27/2019   Constipation, unspecified 03/27/2019   Depression    Family disruption due to divorce or legal separation 03/27/2019   Head ache 03/27/2019   Headache(784.0)    Hyperlipidemia 03/27/2019   Major depressive disorder 03/27/2019   Migraine    Mild intermittent asthma with acute exacerbation 03/27/2019   Mild persistent asthma 03/27/2019   Obesity, unspecified 03/27/2019   Oppositional defiant disorder    Rhinitis, allergic 03/27/2019   Vitamin D deficiency, unspecified 03/27/2019    History reviewed. No pertinent surgical history.  Family History  Problem Relation Age of Onset   Bipolar disorder Father    Drug abuse Father    Schizophrenia Father    Migraines Father    Spinal muscular atrophy Father    Diabetes type II Father    Migraines Mother    Hypothyroidism Mother    Migraines Paternal Grandmother    Diabetes type II Paternal Grandmother    Bladder Cancer Paternal Grandmother    Kidney disease Paternal Grandmother    Heart attack Paternal  Grandfather    Diabetes type II Paternal Grandfather    Heart failure Paternal Grandfather    Hypertension Paternal Grandfather    Hypertension Maternal Grandfather    GER disease Sister    Anxiety disorder Sister    Depression Sister    GER disease Brother    Ulcers Brother    ADD / ADHD Maternal Aunt    Migraines Maternal Aunt        2 Aunts   Other Maternal Aunt        1 Aunt has Learning Differences   Cholelithiasis Maternal Aunt    Dementia Neg Hx    Celiac disease Neg Hx     Social History:  reports that she has never smoked. She has been exposed to tobacco smoke. She has never used smokeless tobacco. She reports that she does not drink alcohol and does not use drugs.  Allergies:  Allergies  Allergen Reactions   Amoxicillin Rash   Zoloft [Sertraline Hcl] Hives    Medications: I have reviewed the patient's current medications.  Results for orders placed or performed during the hospital encounter of 02/16/22 (from the past 48 hour(s))  CBC     Status: None   Collection Time: 02/16/22  3:12 AM  Result Value Ref Range   WBC 7.6 4.0 - 10.5 K/uL   RBC 4.81 3.87 - 5.11 MIL/uL   Hemoglobin 13.1 12.0 - 15.0 g/dL   HCT 25.0 53.9 - 76.7 %   MCV 85.7 80.0 - 100.0 fL   MCH  27.2 26.0 - 34.0 pg   MCHC 31.8 30.0 - 36.0 g/dL   RDW 58.5 27.7 - 82.4 %   Platelets 337 150 - 400 K/uL   nRBC 0.0 0.0 - 0.2 %    Comment: Performed at [facility], [address], [place], [place] [address]  Comprehensive metabolic panel     Status: Abnormal   Collection Time: 02/16/22  3:12 AM  Result Value Ref Range   Sodium 138 135 - 145 mmol/L   Potassium 3.7 3.5 - 5.1 mmol/L   Chloride 106 98 - 111 mmol/L   CO2 27 22 - 32 mmol/L   Glucose, Bld 140 (H) 70 - 99 mg/dL    Comment: Glucose reference range applies only to samples taken after fasting for at least 8 hours.   BUN 9 6 - 20 mg/dL   Creatinine, Ser 1.44 0.44 - 1.00 mg/dL   Calcium 9.0 8.9 - 31.5 mg/dL   Total Protein 7.5 6.5 - 8.1 g/dL    Albumin 4.0 3.5 - 5.0 g/dL   AST 17 15 - 41 U/L   ALT 27 0 - 44 U/L   Alkaline Phosphatase 53 38 - 126 U/L   Total Bilirubin 0.2 (L) 0.3 - 1.2 mg/dL   GFR, Estimated >40 >08 mL/min    Comment: (NOTE) Calculated using the CKD-EPI Creatinine Equation (2021)    Anion gap 5 5 - 15    Comment: Performed at [facility], [address] [place], [place] [address]  Lipase, blood     Status: None   Collection Time: 02/16/22  3:12 AM  Result Value Ref Range   Lipase 30 11 - 51 U/L    Comment: Performed at [facility], [address], [place], [place] [address]  Urinalysis, Routine w reflex microscopic Urine, Clean Catch     Status: None   Collection Time: 02/16/22  3:58 AM  Result Value Ref Range   Color, Urine YELLOW YELLOW   APPearance CLEAR CLEAR   Specific Gravity, Urine 1.020 1.005 - 1.030   pH 6.5 5.0 - 8.0   Glucose, UA NEGATIVE NEGATIVE mg/dL   Hgb urine dipstick NEGATIVE NEGATIVE   Bilirubin Urine NEGATIVE NEGATIVE   Ketones, ur NEGATIVE NEGATIVE mg/dL   Protein, ur NEGATIVE NEGATIVE mg/dL   Nitrite NEGATIVE NEGATIVE   Leukocytes,Ua NEGATIVE NEGATIVE    Comment: Microscopic not done on urines with negative protein, blood, leukocytes, nitrite, or glucose < 500 mg/dL. Performed at [facility], [address], [place], [place] [address]   Pregnancy, urine     Status: None   Collection Time: 02/16/22  3:58 AM  Result Value Ref Range   Preg Test, Ur NEGATIVE NEGATIVE    Comment:        THE SENSITIVITY OF THIS METHODOLOGY IS >20 mIU/mL. Performed at [facility], [address] [place], [place] [address]     CT ABDOMEN PELVIS W CONTRAST  Result Date: 02/16/2022 CLINICAL DATA:  Right lower quadrant abdominal pain.  Vomiting. EXAM: CT ABDOMEN AND PELVIS WITH CONTRAST TECHNIQUE: Multidetector CT imaging of the abdomen and pelvis was performed using the standard protocol following bolus administration of intravenous contrast. RADIATION DOSE REDUCTION: This exam was performed  according to the departmental dose-optimization program which includes automated exposure control, adjustment of the mA and/or kV according to patient size and/or use of iterative reconstruction technique. CONTRAST:  OMNIPAQUE IOHEXOL 350 MG/ML SOLN COMPARISON:  10/08/2019 FINDINGS: Lower chest: Unremarkable. Hepatobiliary: No suspicious focal abnormality within the  liver parenchyma. There is no evidence for gallstones, gallbladder wall thickening, or pericholecystic fluid. No intrahepatic or extrahepatic biliary dilation. Pancreas: No focal mass lesion. No dilatation of the main duct. No intraparenchymal cyst. No peripancreatic edema. Spleen: No splenomegaly. No focal mass lesion. Adrenals/Urinary Tract: No adrenal nodule or mass. Kidneys unremarkable. No evidence for hydroureter. The urinary bladder appears normal for the degree of distention. Stomach/Bowel: Stomach is unremarkable. No gastric wall thickening. No evidence of outlet obstruction. Duodenum is normally positioned as is the ligament of Treitz. No small bowel wall thickening. No small bowel dilatation. The terminal ileum is normal. Appendix is elongated and dilated up to 7-8 mm (increased from 5 mm previously) with apparent transmural hyperenhancement and ill-defined mild wall thickening. Subtle periappendiceal edema is suspected on coronal images 53-57 of series 5 and sagittal image 45 of series 6. No evidence for extraluminal gas or right lower quadrant abscess. No gross colonic mass. No colonic wall thickening. Vascular/Lymphatic: No abdominal aortic aneurysm. No abdominal aortic atherosclerotic calcification. There is no gastrohepatic or hepatoduodenal ligament lymphadenopathy. No retroperitoneal or mesenteric lymphadenopathy. Reproductive: The uterus is unremarkable.  There is no adnexal mass. Other: No intraperitoneal free fluid. Musculoskeletal: No worrisome lytic or sclerotic osseous abnormality. IMPRESSION: 1. Subtle changes in and around  the appendix suspicious for early acute appendicitis. No findings to suggest perforation or abscess. 2. Otherwise unremarkable exam. Electronically Signed   By: Kennith Center M.D.   On: 02/16/2022 05:45    ROS:  Pertinent items are noted in HPI.  Blood pressure 114/67, pulse 71, temperature 97.8 F (36.6 C), temperature source Oral, resp. rate 18, height 5\' 10"  (1.778 m), weight (!) 142.9 kg, SpO2 100 %. Physical Exam: Pleasant morbidly obese white female no acute distress Head is normocephalic, atraumatic Lungs clear to auscultation with equal breath sounds bilaterally Heart examination reveals regular rate and rhythm without S3, S4, murmurs Abdomen is soft with minimal tenderness along the right flank.  No McBurney's point tenderness is noted.  No rigidity is noted.  CT scan images personally reviewed  Assessment/Plan: Impression: Early acute appendicitis Plan: I did discuss options including appendectomy versus antibiotic outpatient therapy.  She would like to proceed with a laparoscopic appendectomy.  The risks and benefits of the procedure including bleeding, infection, and the possibility of an open procedure were fully explained to the patient, who gave informed consent.  02/16/2022, 7:51 AM

## 2022-02-16 NOTE — Transfer of Care (Signed)
Immediate Anesthesia Transfer of Care Note  Patient: Kimberly Munoz  Procedure(s) Performed: APPENDECTOMY LAPAROSCOPIC (Abdomen)  Patient Location: PACU  Anesthesia Type:General  Level of Consciousness: awake, alert  and oriented  Airway & Oxygen Therapy: Patient Spontanous Breathing  Post-op Assessment: Report given to RN and Post -op Vital signs reviewed and stable  Post vital signs: Reviewed and stable  Last Vitals:  Vitals Value Taken Time  BP 142/93 02/16/22 1330  Temp    Pulse 77 02/16/22 1338  Resp 19 02/16/22 1338  SpO2 98 % 02/16/22 1338  Vitals shown include unvalidated device data.  Last Pain:  Vitals:   02/16/22 0713  TempSrc: Oral  PainSc: 3          Complications: No notable events documented.

## 2022-02-16 NOTE — Anesthesia Postprocedure Evaluation (Signed)
Anesthesia Post Note  Patient: Kimberly Munoz  Procedure(s) Performed: APPENDECTOMY LAPAROSCOPIC (Abdomen)  Patient location during evaluation: Phase II Anesthesia Type: General Level of consciousness: awake and alert and oriented Pain management: pain level controlled Vital Signs Assessment: post-procedure vital signs reviewed and stable Respiratory status: spontaneous breathing, nonlabored ventilation and respiratory function stable Cardiovascular status: blood pressure returned to baseline and stable Postop Assessment: no apparent nausea or vomiting Anesthetic complications: no   No notable events documented.   Last Vitals:  Vitals:   02/16/22 1415 02/16/22 1422  BP: 135/83 132/82  Pulse: (!) 55 68  Resp: 13 18  Temp:  36.5 C  SpO2: 97% 97%    Last Pain:  Vitals:   02/16/22 1422  TempSrc: Oral  PainSc: 2                  Sohail Capraro C Agueda [facility]ou[facility]

## 2022-02-16 NOTE — ED Triage Notes (Signed)
Pt c/o right sided abd pain since Tuesday and vomiting started Thursday.

## 2022-02-16 NOTE — ED Provider Notes (Signed)
7:06 AM Patient signed out to me by previous ED physician. Possibly early appendicitis. Pending surgery eval.   Physical Exam  BP 132/87   Pulse 77   Temp 98.2 F (36.8 C)   Resp 18   Ht 5\' 10"  (1.778 m)   Wt (!) 142.9 kg   SpO2 99%   BMI 45.20 kg/m   Physical Exam Vitals and nursing note reviewed.  Constitutional:      Appearance: She is well-developed.  Cardiovascular:     Rate and Rhythm: Normal rate.  Pulmonary:     Effort: Pulmonary effort is normal.  Neurological:     Mental Status: She is alert.     Procedures  Procedures  ED Course / MDM    Medical Decision Making Amount and/or Complexity of Data Reviewed Labs: ordered. Radiology: ordered.  Risk Prescription drug management.   Patient evaluated by general surgery team with attending physician Dr. plan to take patient to the OR this morning for appendectomy.  Ceftin and Flagyl ordered.  Patient n.p.o.       Lovell Sheehan, DO 02/16/22 [phone]

## 2022-02-16 NOTE — Interval H&P Note (Signed)
History and Physical Interval Note:  02/16/2022 12:03 PM  Kimberly Munoz  has presented today for surgery, with the diagnosis of acute appendicitis.  The various methods of treatment have been discussed with the patient and family. After consideration of risks, benefits and other options for treatment, the patient has consented to  Procedure(s): APPENDECTOMY LAPAROSCOPIC (N/A) as a surgical intervention.  The patient's history has been reviewed, patient examined, no change in status, stable for surgery.  I have reviewed the patient's chart and labs.  Questions were answered to the patient's satisfaction.     Franky Macho

## 2022-02-16 NOTE — Anesthesia Preprocedure Evaluation (Signed)
Anesthesia Evaluation  Patient identified by MRN, date of birth, ID band Patient awake    Reviewed: Allergy & Precautions, NPO status , Patient's Chart, lab work & pertinent test results  Airway Mallampati: II  TM Distance: >3 FB Neck ROM: Full    Dental  (+) Dental Advisory Given, Chipped   Pulmonary asthma ,    Pulmonary exam normal breath sounds clear to auscultation       Cardiovascular negative cardio ROS Normal cardiovascular exam Rhythm:Regular Rate:Normal     Neuro/Psych  Headaches, PSYCHIATRIC DISORDERS Anxiety Depression    GI/Hepatic negative GI ROS, Neg liver ROS,   Endo/Other  Morbid obesity  Renal/GU negative Renal ROS  negative genitourinary   Musculoskeletal negative musculoskeletal ROS (+)   Abdominal   Peds  (+) ADHD Hematology negative hematology ROS (+)   Anesthesia Other Findings   Reproductive/Obstetrics negative OB ROS                            Anesthesia Physical Anesthesia Plan  ASA: 3  Anesthesia Plan: General   Post-op Pain Management: Dilaudid IV   Induction: Intravenous  PONV Risk Score and Plan: 4 or greater and Ondansetron, Dexamethasone and Midazolam  Airway Management Planned: Oral ETT  Additional Equipment:   Intra-op Plan:   Post-operative Plan: Extubation in OR  Informed Consent:     Dental advisory given  Plan Discussed with: CRNA and Surgeon  Anesthesia Plan Comments:         Anesthesia Quick Evaluation

## 2022-02-19 ENCOUNTER — Encounter (HOSPITAL_COMMUNITY): Payer: Self-pay | Admitting: General Surgery

## 2022-02-19 LAB — SURGICAL PATHOLOGY

## 2022-10-18 ENCOUNTER — Telehealth: Payer: Self-pay

## 2022-10-18 NOTE — Telephone Encounter (Signed)
Mychart msg sent
